# Patient Record
Sex: Female | Born: 1970 | Race: White | Hispanic: No | Marital: Married | Smoking: Former smoker
Health system: Southern US, Community
[De-identification: ages and names within clinical notes are randomized; demographics above are authoritative.]

## PROBLEM LIST (undated history)

## (undated) DIAGNOSIS — G4700 Insomnia, unspecified: Secondary | ICD-10-CM

## (undated) DIAGNOSIS — B019 Varicella without complication: Secondary | ICD-10-CM

## (undated) DIAGNOSIS — E663 Overweight: Secondary | ICD-10-CM

## (undated) DIAGNOSIS — R5383 Other fatigue: Secondary | ICD-10-CM

## (undated) DIAGNOSIS — Z0000 Encounter for general adult medical examination without abnormal findings: Secondary | ICD-10-CM

## (undated) DIAGNOSIS — M791 Myalgia, unspecified site: Secondary | ICD-10-CM

## (undated) DIAGNOSIS — R110 Nausea: Secondary | ICD-10-CM

## (undated) DIAGNOSIS — E559 Vitamin D deficiency, unspecified: Principal | ICD-10-CM

## (undated) DIAGNOSIS — N951 Menopausal and female climacteric states: Secondary | ICD-10-CM

## (undated) HISTORY — DX: Menopausal and female climacteric states: N95.1

## (undated) HISTORY — DX: Overweight: E66.3

## (undated) HISTORY — DX: Encounter for general adult medical examination without abnormal findings: Z00.00

## (undated) HISTORY — DX: Varicella without complication: B01.9

## (undated) HISTORY — DX: Myalgia, unspecified site: M79.10

## (undated) HISTORY — DX: Insomnia, unspecified: G47.00

## (undated) HISTORY — DX: Other fatigue: R53.83

## (undated) HISTORY — DX: Nausea: R11.0

## (undated) HISTORY — DX: Vitamin D deficiency, unspecified: E55.9

---

## 1998-06-30 ENCOUNTER — Other Ambulatory Visit: Admission: RE | Admit: 1998-06-30 | Discharge: 1998-06-30 | Payer: Self-pay | Admitting: Gynecology

## 1999-02-07 ENCOUNTER — Inpatient Hospital Stay (HOSPITAL_COMMUNITY): Admission: AD | Admit: 1999-02-07 | Discharge: 1999-02-07 | Payer: Self-pay

## 1999-02-08 ENCOUNTER — Inpatient Hospital Stay (HOSPITAL_COMMUNITY): Admission: AD | Admit: 1999-02-08 | Discharge: 1999-02-09 | Payer: Self-pay | Admitting: Family Medicine

## 1999-07-17 ENCOUNTER — Other Ambulatory Visit: Admission: RE | Admit: 1999-07-17 | Discharge: 1999-07-17 | Payer: Self-pay | Admitting: Obstetrics & Gynecology

## 2000-11-25 ENCOUNTER — Other Ambulatory Visit: Admission: RE | Admit: 2000-11-25 | Discharge: 2000-11-25 | Payer: Self-pay | Admitting: Obstetrics and Gynecology

## 2002-02-15 HISTORY — PX: OTHER SURGICAL HISTORY: SHX169

## 2002-02-15 HISTORY — PX: BREAST SURGERY: SHX581

## 2003-12-12 ENCOUNTER — Other Ambulatory Visit: Admission: RE | Admit: 2003-12-12 | Discharge: 2003-12-12 | Payer: Self-pay | Admitting: Obstetrics and Gynecology

## 2005-03-15 ENCOUNTER — Emergency Department (HOSPITAL_COMMUNITY): Admission: EM | Admit: 2005-03-15 | Discharge: 2005-03-15 | Payer: Self-pay | Admitting: Emergency Medicine

## 2005-05-07 ENCOUNTER — Other Ambulatory Visit: Admission: RE | Admit: 2005-05-07 | Discharge: 2005-05-07 | Payer: Self-pay | Admitting: Obstetrics and Gynecology

## 2011-06-16 LAB — HM PAP SMEAR

## 2011-06-16 LAB — HM MAMMOGRAPHY

## 2011-09-23 ENCOUNTER — Ambulatory Visit (INDEPENDENT_AMBULATORY_CARE_PROVIDER_SITE_OTHER): Payer: BC Managed Care – PPO | Admitting: Family Medicine

## 2011-09-23 ENCOUNTER — Encounter: Payer: Self-pay | Admitting: Family Medicine

## 2011-09-23 VITALS — BP 150/78 | HR 82 | Temp 97.4°F | Ht 62.25 in | Wt 151.8 lb

## 2011-09-23 DIAGNOSIS — E663 Overweight: Secondary | ICD-10-CM | POA: Insufficient documentation

## 2011-09-23 DIAGNOSIS — R5383 Other fatigue: Secondary | ICD-10-CM

## 2011-09-23 DIAGNOSIS — Z0000 Encounter for general adult medical examination without abnormal findings: Secondary | ICD-10-CM

## 2011-09-23 DIAGNOSIS — G4700 Insomnia, unspecified: Secondary | ICD-10-CM | POA: Insufficient documentation

## 2011-09-23 DIAGNOSIS — E559 Vitamin D deficiency, unspecified: Secondary | ICD-10-CM

## 2011-09-23 HISTORY — DX: Vitamin D deficiency, unspecified: E55.9

## 2011-09-23 HISTORY — DX: Encounter for general adult medical examination without abnormal findings: Z00.00

## 2011-09-23 HISTORY — DX: Other fatigue: R53.83

## 2011-09-23 LAB — CBC
Hemoglobin: 13.1 g/dL (ref 12.0–15.0)
MCHC: 33.4 g/dL (ref 30.0–36.0)
RDW: 13 % (ref 11.5–14.6)
WBC: 6.1 10*3/uL (ref 4.5–10.5)

## 2011-09-23 LAB — LIPID PANEL
HDL: 48.7 mg/dL (ref >39.00)
Total CHOL/HDL Ratio: 4
Triglycerides: 80 mg/dL (ref 0.0–149.0)
VLDL: 16 mg/dL (ref 0.0–40.0)

## 2011-09-23 LAB — RENAL FUNCTION PANEL
BUN: 10 mg/dL (ref 6–23)
CO2: 27 mEq/L (ref 19–32)
Chloride: 104 mEq/L (ref 96–112)
GFR: 75.34 mL/min (ref >60.00)
Phosphorus: 2.7 mg/dL (ref 2.3–4.6)
Potassium: 4.1 mEq/L (ref 3.5–5.1)

## 2011-09-23 LAB — HEPATIC FUNCTION PANEL
Alkaline Phosphatase: 52 U/L (ref 39–117)
Bilirubin, Direct: 0 mg/dL (ref 0.0–0.3)
Total Bilirubin: 0.4 mg/dL (ref 0.3–1.2)

## 2011-09-23 LAB — TSH: TSH: 1.73 u[IU]/mL (ref 0.35–5.50)

## 2011-09-23 LAB — LDL CHOLESTEROL, DIRECT: Direct LDL: 154.7 mg/dL

## 2011-09-23 NOTE — Assessment & Plan Note (Signed)
Requesting old records from previous PMD and OB/GYN she has had a pap this year but agrees to transfer her care in that regard when a pap is due. Encouraged 7-8 hours of sleep, regular exercise, DASH diet. Labs ordered today

## 2011-09-23 NOTE — Assessment & Plan Note (Signed)
Given paperwork on DASH diet, avoid simple carbs and trans fats, check labs, add regular exercise.

## 2011-09-23 NOTE — Assessment & Plan Note (Signed)
Multifactorial encouraged healthy diet with less carbs, 7-8 hours of sleep and regular exercise, check tsh and cbc

## 2011-09-23 NOTE — Progress Notes (Signed)
Patient ID: Natasha Watts, female   DOB: 25-Sep-1970, 41 y.o.   MRN: [id number] Natasha Watts [id number] 04-Mar-1970 09/23/2011      Progress Note-Follow Up  Subjective  Chief Complaint  Chief Complaint  Patient presents with  . Establish Care    new patient    HPI  Patient is a 41 year old Caucasian female who is in today for new patient appointment. Her major concerns regard sleeping, fatigue and weight gain. She struggles with the inability to fall sleep or stay asleep and only gets 4 hours of sleep most nights. She has always been a light sleeper but at this point also her husband. She notes as a result of poor time concentrating and struggles with constant fatigue. She's no longer exercising due to her fatigue and is frustrated regarding her weight gain. She finds herself craving sweets salty starchy carbohydrates. She's not had any recent acute illness. She is noting some trouble with pain in her soles of her feet. No injury but pain is worse at the end of the long day. No swelling, warmth or redness is noted. She denies any recent chest pain, palpitations, shortness of breath, GI or GU concerns.  Past Medical History  Diagnosis Date  . Chicken pox as a child  . Insomnia   . Overweight   . Vitamin d deficiency 09/23/2011  . Preventative health care 09/23/2011  . Fatigue 09/23/2011    Past Surgical History  Procedure Date  . Breast surgery 2004    breast lift  . Tummy tuck 2004    Family History  Problem Relation Age of Onset  . Cancer Maternal Grandmother   . Cancer Maternal Grandfather     esophagus  . Asthma Daughter     History   Social History  . Marital Status: Married    Spouse Name: N/A    Number of Children: N/A  . Years of Education: N/A   Occupational History  . Not on file.   Social History Main Topics  . Smoking status: Former Smoker -- 1.0 packs/day for 6 years    Types: Cigarettes    Quit date: 02/15/1990  . Smokeless tobacco: Never Used  .  Alcohol Use: No  . Drug Use: No  . Sexually Active: Not on file   Other Topics Concern  . Not on file   Social History Narrative  . No narrative on file    No current outpatient prescriptions on file prior to visit.    Allergies  Allergen Reactions  . Hydrocodone     Itch, felt like she was going "crazy"    Review of Systems  Review of Systems  Constitutional: Positive for malaise/fatigue. Negative for fever and chills.  HENT: Negative for hearing loss, nosebleeds and congestion.   Eyes: Negative for discharge.  Respiratory: Negative for cough, sputum production, shortness of breath and wheezing.   Cardiovascular: Negative for chest pain, palpitations and leg swelling.  Gastrointestinal: Negative for heartburn, nausea, vomiting, abdominal pain, diarrhea, constipation and blood in stool.  Genitourinary: Negative for dysuria, urgency, frequency and hematuria.  Musculoskeletal: Negative for myalgias, back pain and falls.  Skin: Negative for rash.  Neurological: Negative for dizziness, tremors, sensory change, focal weakness, loss of consciousness, weakness and headaches.  Endo/Heme/Allergies: Negative for polydipsia. Does not bruise/bleed easily.  Psychiatric/Behavioral: Negative for depression and suicidal ideas. The patient is nervous/anxious and has insomnia.     Objective  BP 150/78  Pulse 82  Temp 97.4 F (36.3 C) (Temporal)  Ht 5' 2.25" (1.581 m)  Wt 151 lb 12.8 oz (68.856 kg)  BMI 27.54 kg/m2  SpO2 97%  LMP 09/17/2011  Physical Exam  Physical Exam  Constitutional: She is oriented to person, place, and time and well-developed, well-nourished, and in no distress. No distress.  HENT:  Head: Normocephalic and atraumatic.  Right Ear: External ear normal.  Left Ear: External ear normal.  Nose: Nose normal.  Mouth/Throat: Oropharynx is clear and moist. No oropharyngeal exudate.  Eyes: Conjunctivae are normal. Pupils are equal, round, and reactive to light.  Right eye exhibits no discharge. Left eye exhibits no discharge. No scleral icterus.  Neck: Normal range of motion. Neck supple. No thyromegaly present.  Cardiovascular: Normal rate, regular rhythm, normal heart sounds and intact distal pulses.   No murmur heard. Pulmonary/Chest: Effort normal and breath sounds normal. No respiratory distress. She has no wheezes. She has no rales.  Abdominal: Soft. Bowel sounds are normal. She exhibits no distension and no mass. There is no tenderness.  Musculoskeletal: Normal range of motion. She exhibits no edema and no tenderness.  Lymphadenopathy:    She has no cervical adenopathy.  Neurological: She is alert and oriented to person, place, and time. She has normal reflexes. No cranial nerve deficit. Coordination normal.  Skin: Skin is warm and dry. No rash noted. She is not diaphoretic.  Psychiatric: Mood, memory and affect normal.       Assessment & Plan  Preventative health care Requesting old records from previous PMD and OB/GYN she has had a pap this year but agrees to transfer her care in that regard when a pap is due. Encouraged 7-8 hours of sleep, regular exercise, DASH diet. Labs ordered today  Fatigue Multifactorial encouraged healthy diet with less carbs, 7-8 hours of sleep and regular exercise, check tsh and cbc  Insomnia Try Benadryl prn, discussed sleep hygiene at length with need for dark, cool quiet room  Overweight Given paperwork on DASH diet, avoid simple carbs and trans fats, check labs, add regular exercise.

## 2011-09-23 NOTE — Assessment & Plan Note (Signed)
Try Benadryl prn, discussed sleep hygiene at length with need for dark, cool quiet room

## 2011-09-23 NOTE — Patient Instructions (Addendum)
Preventive Care for Adults, Female A healthy lifestyle and preventive care can promote health and wellness. Preventive health guidelines for women include the following key practices.  A routine yearly physical is a good way to check with your caregiver about your health and preventive screening. It is a chance to share any concerns and updates on your health, and to receive a thorough exam.   Visit your dentist for a routine exam and preventive care every 6 months. Brush your teeth twice a day and floss once a day. Good oral hygiene prevents tooth decay and gum disease.   The frequency of eye exams is based on your age, health, family medical history, use of contact lenses, and other factors. Follow your caregiver's recommendations for frequency of eye exams.   Eat a healthy diet. Foods like vegetables, fruits, whole grains, low-fat dairy products, and lean protein foods contain the nutrients you need without too many calories. Decrease your intake of foods high in solid fats, added sugars, and salt. Eat the right amount of calories for you.Get information about a proper diet from your caregiver, if necessary.   Regular physical exercise is one of the most important things you can do for your health. Most adults should get at least 150 minutes of moderate-intensity exercise (any activity that increases your heart rate and causes you to sweat) each week. In addition, most adults need muscle-strengthening exercises on 2 or more days a week.   Maintain a healthy weight. The body mass index (BMI) is a screening tool to identify possible weight problems. It provides an estimate of body fat based on height and weight. Your caregiver can help determine your BMI, and can help you achieve or maintain a healthy weight.For adults 20 years and older:   A BMI below 18.5 is considered underweight.   A BMI of 18.5 to 24.9 is normal.   A BMI of 25 to 29.9 is considered overweight.   A BMI of 30 and above is  considered obese.   Maintain normal blood lipids and cholesterol levels by exercising and minimizing your intake of saturated fat. Eat a balanced diet with plenty of fruit and vegetables. Blood tests for lipids and cholesterol should begin at age 20 and be repeated every 5 years. If your lipid or cholesterol levels are high, you are over 50, or you are at high risk for heart disease, you may need your cholesterol levels checked more frequently.Ongoing high lipid and cholesterol levels should be treated with medicines if diet and exercise are not effective.   If you smoke, find out from your caregiver how to quit. If you do not use tobacco, do not start.   If you are pregnant, do not drink alcohol. If you are breastfeeding, be very cautious about drinking alcohol. If you are not pregnant and choose to drink alcohol, do not exceed 1 drink per day. One drink is considered to be 12 ounces (355 mL) of beer, 5 ounces (148 mL) of wine, or 1.5 ounces (44 mL) of liquor.   Avoid use of street drugs. Do not share needles with anyone. Ask for help if you need support or instructions about stopping the use of drugs.   High blood pressure causes heart disease and increases the risk of stroke. Your blood pressure should be checked at least every 1 to 2 years. Ongoing high blood pressure should be treated with medicines if weight loss and exercise are not effective.   If you are 55 to 43   years old, ask your caregiver if you should take aspirin to prevent strokes.   Diabetes screening involves taking a blood sample to check your fasting blood sugar level. This should be done once every 3 years, after age 45, if you are within normal weight and without risk factors for diabetes. Testing should be considered at a younger age or be carried out more frequently if you are overweight and have at least 1 risk factor for diabetes.   Breast cancer screening is essential preventive care for women. You should practice "breast  self-awareness." This means understanding the normal appearance and feel of your breasts and may include breast self-examination. Any changes detected, no matter how small, should be reported to a caregiver. Women in their 20s and 30s should have a clinical breast exam (CBE) by a caregiver as part of a regular health exam every 1 to 3 years. After age 40, women should have a CBE every year. Starting at age 40, women should consider having a mammography (breast X-ray test) every year. Women who have a family history of breast cancer should talk to their caregiver about genetic screening. Women at a high risk of breast cancer should talk to their caregivers about having magnetic resonance imaging (MRI) and a mammography every year.   The Pap test is a screening test for cervical cancer. A Pap test can show cell changes on the cervix that might become cervical cancer if left untreated. A Pap test is a procedure in which cells are obtained and examined from the lower end of the uterus (cervix).   Women should have a Pap test starting at age 21.   Between ages 21 and 29, Pap tests should be repeated every 2 years.   Beginning at age 30, you should have a Pap test every 3 years as long as the past 3 Pap tests have been normal.   Some women have medical problems that increase the chance of getting cervical cancer. Talk to your caregiver about these problems. It is especially important to talk to your caregiver if a new problem develops soon after your last Pap test. In these cases, your caregiver may recommend more frequent screening and Pap tests.   The above recommendations are the same for women who have or have not gotten the vaccine for human papillomavirus (HPV).   If you had a hysterectomy for a problem that was not cancer or a condition that could lead to cancer, then you no longer need Pap tests. Even if you no longer need a Pap test, a regular exam is a good idea to make sure no other problems are  starting.   If you are between ages 65 and 70, and you have had normal Pap tests going back 10 years, you no longer need Pap tests. Even if you no longer need a Pap test, a regular exam is a good idea to make sure no other problems are starting.   If you have had past treatment for cervical cancer or a condition that could lead to cancer, you need Pap tests and screening for cancer for at least 20 years after your treatment.   If Pap tests have been discontinued, risk factors (such as a new sexual partner) need to be reassessed to determine if screening should be resumed.   The HPV test is an additional test that may be used for cervical cancer screening. The HPV test looks for the virus that can cause the cell changes on the cervix.   The cells collected during the Pap test can be tested for HPV. The HPV test could be used to screen women aged 30 years and older, and should be used in women of any age who have unclear Pap test results. After the age of 30, women should have HPV testing at the same frequency as a Pap test.   Colorectal cancer can be detected and often prevented. Most routine colorectal cancer screening begins at the age of 50 and continues through age 75. However, your caregiver may recommend screening at an earlier age if you have risk factors for colon cancer. On a yearly basis, your caregiver may provide home test kits to check for hidden blood in the stool. Use of a small camera at the end of a tube, to directly examine the colon (sigmoidoscopy or colonoscopy), can detect the earliest forms of colorectal cancer. Talk to your caregiver about this at age 50, when routine screening begins. Direct examination of the colon should be repeated every 5 to 10 years through age 75, unless early forms of pre-cancerous polyps or small growths are found.   Hepatitis C blood testing is recommended for all people born from 1945 through 1965 and any individual with known risks for hepatitis C.    Practice safe sex. Use condoms and avoid high-risk sexual practices to reduce the spread of sexually transmitted infections (STIs). STIs include gonorrhea, chlamydia, syphilis, trichomonas, herpes, HPV, and human immunodeficiency virus (HIV). Herpes, HIV, and HPV are viral illnesses that have no cure. They can result in disability, cancer, and death. Sexually active women aged 25 and younger should be checked for chlamydia. Older women with new or multiple partners should also be tested for chlamydia. Testing for other STIs is recommended if you are sexually active and at increased risk.   Osteoporosis is a disease in which the bones lose minerals and strength with aging. This can result in serious bone fractures. The risk of osteoporosis can be identified using a bone density scan. Women ages 65 and over and women at risk for fractures or osteoporosis should discuss screening with their caregivers. Ask your caregiver whether you should take a calcium supplement or vitamin D to reduce the rate of osteoporosis.   Menopause can be associated with physical symptoms and risks. Hormone replacement therapy is available to decrease symptoms and risks. You should talk to your caregiver about whether hormone replacement therapy is right for you.   Use sunscreen with sun protection factor (SPF) of 30 or more. Apply sunscreen liberally and repeatedly throughout the day. You should seek shade when your shadow is shorter than you. Protect yourself by wearing long sleeves, pants, a wide-brimmed hat, and sunglasses year round, whenever you are outdoors.   Once a month, do a whole body skin exam, using a mirror to look at the skin on your back. Notify your caregiver of new moles, moles that have irregular borders, moles that are larger than a pencil eraser, or moles that have changed in shape or color.   Stay current with required immunizations.   Influenza. You need a dose every fall (or winter). The composition of  the flu vaccine changes each year, so being vaccinated once is not enough.   Pneumococcal polysaccharide. You need 1 to 2 doses if you smoke cigarettes or if you have certain chronic medical conditions. You need 1 dose at age 65 (or older) if you have never been vaccinated.   Tetanus, diphtheria, pertussis (Tdap, Td). Get 1 dose of   Tdap vaccine if you are younger than age 65, are over 65 and have contact with an infant, are a healthcare worker, are pregnant, or simply want to be protected from whooping cough. After that, you need a Td booster dose every 10 years. Consult your caregiver if you have not had at least 3 tetanus and diphtheria-containing shots sometime in your life or have a deep or dirty wound.   HPV. You need this vaccine if you are a woman age 26 or younger. The vaccine is given in 3 doses over 6 months.   Measles, mumps, rubella (MMR). You need at least 1 dose of MMR if you were born in 1957 or later. You may also need a second dose.   Meningococcal. If you are age 19 to 21 and a first-year college student living in a residence hall, or have one of several medical conditions, you need to get vaccinated against meningococcal disease. You may also need additional booster doses.   Zoster (shingles). If you are age 60 or older, you should get this vaccine.   Varicella (chickenpox). If you have never had chickenpox or you were vaccinated but received only 1 dose, talk to your caregiver to find out if you need this vaccine.   Hepatitis A. You need this vaccine if you have a specific risk factor for hepatitis A virus infection or you simply wish to be protected from this disease. The vaccine is usually given as 2 doses, 6 to 18 months apart.   Hepatitis B. You need this vaccine if you have a specific risk factor for hepatitis B virus infection or you simply wish to be protected from this disease. The vaccine is given in 3 doses, usually over 6 months.  Preventive Services /  Frequency Ages 19 to 39  Blood pressure check.** / Every 1 to 2 years.   Lipid and cholesterol check.** / Every 5 years beginning at age 20.   Clinical breast exam.** / Every 3 years for women in their 20s and 30s.   Pap test.** / Every 2 years from ages 21 through 29. Every 3 years starting at age 30 through age 65 or 70 with a history of 3 consecutive normal Pap tests.   HPV screening.** / Every 3 years from ages 30 through ages 65 to 70 with a history of 3 consecutive normal Pap tests.   Hepatitis C blood test.** / For any individual with known risks for hepatitis C.   Skin self-exam. / Monthly.   Influenza immunization.** / Every year.   Pneumococcal polysaccharide immunization.** / 1 to 2 doses if you smoke cigarettes or if you have certain chronic medical conditions.   Tetanus, diphtheria, pertussis (Tdap, Td) immunization. / A one-time dose of Tdap vaccine. After that, you need a Td booster dose every 10 years.   HPV immunization. / 3 doses over 6 months, if you are 26 and younger.   Measles, mumps, rubella (MMR) immunization. / You need at least 1 dose of MMR if you were born in 1957 or later. You may also need a second dose.   Meningococcal immunization. / 1 dose if you are age 19 to 21 and a first-year college student living in a residence hall, or have one of several medical conditions, you need to get vaccinated against meningococcal disease. You may also need additional booster doses.   Varicella immunization.** / Consult your caregiver.   Hepatitis A immunization.** / Consult your caregiver. 2 doses, 6 to 18 months   apart.   Hepatitis B immunization.** / Consult your caregiver. 3 doses usually over 6 months.  Ages 40 to 64  Blood pressure check.** / Every 1 to 2 years.   Lipid and cholesterol check.** / Every 5 years beginning at age 20.   Clinical breast exam.** / Every year after age 40.   Mammogram.** / Every year beginning at age 40 and continuing for as  long as you are in good health. Consult with your caregiver.   Pap test.** / Every 3 years starting at age 30 through age 65 or 70 with a history of 3 consecutive normal Pap tests.   HPV screening.** / Every 3 years from ages 30 through ages 65 to 70 with a history of 3 consecutive normal Pap tests.   Fecal occult blood test (FOBT) of stool. / Every year beginning at age 50 and continuing until age 75. You may not need to do this test if you get a colonoscopy every 10 years.   Flexible sigmoidoscopy or colonoscopy.** / Every 5 years for a flexible sigmoidoscopy or every 10 years for a colonoscopy beginning at age 50 and continuing until age 75.   Hepatitis C blood test.** / For all people born from 1945 through 1965 and any individual with known risks for hepatitis C.   Skin self-exam. / Monthly.   Influenza immunization.** / Every year.   Pneumococcal polysaccharide immunization.** / 1 to 2 doses if you smoke cigarettes or if you have certain chronic medical conditions.   Tetanus, diphtheria, pertussis (Tdap, Td) immunization.** / A one-time dose of Tdap vaccine. After that, you need a Td booster dose every 10 years.   Measles, mumps, rubella (MMR) immunization. / You need at least 1 dose of MMR if you were born in 1957 or later. You may also need a second dose.   Varicella immunization.** / Consult your caregiver.   Meningococcal immunization.** / Consult your caregiver.   Hepatitis A immunization.** / Consult your caregiver. 2 doses, 6 to 18 months apart.   Hepatitis B immunization.** / Consult your caregiver. 3 doses, usually over 6 months.  Ages 65 and over  Blood pressure check.** / Every 1 to 2 years.   Lipid and cholesterol check.** / Every 5 years beginning at age 20.   Clinical breast exam.** / Every year after age 40.   Mammogram.** / Every year beginning at age 40 and continuing for as long as you are in good health. Consult with your caregiver.   Pap test.** /  Every 3 years starting at age 30 through age 65 or 70 with a 3 consecutive normal Pap tests. Testing can be stopped between 65 and 70 with 3 consecutive normal Pap tests and no abnormal Pap or HPV tests in the past 10 years.   HPV screening.** / Every 3 years from ages 30 through ages 65 or 70 with a history of 3 consecutive normal Pap tests. Testing can be stopped between 65 and 70 with 3 consecutive normal Pap tests and no abnormal Pap or HPV tests in the past 10 years.   Fecal occult blood test (FOBT) of stool. / Every year beginning at age 50 and continuing until age 75. You may not need to do this test if you get a colonoscopy every 10 years.   Flexible sigmoidoscopy or colonoscopy.** / Every 5 years for a flexible sigmoidoscopy or every 10 years for a colonoscopy beginning at age 50 and continuing until age 75.   Hepatitis   C blood test.** / For all people born from 1945 through 1965 and any individual with known risks for hepatitis C.   Osteoporosis screening.** / A one-time screening for women ages 65 and over and women at risk for fractures or osteoporosis.   Skin self-exam. / Monthly.   Influenza immunization.** / Every year.   Pneumococcal polysaccharide immunization.** / 1 dose at age 65 (or older) if you have never been vaccinated.   Tetanus, diphtheria, pertussis (Tdap, Td) immunization. / A one-time dose of Tdap vaccine if you are over 65 and have contact with an infant, are a healthcare worker, or simply want to be protected from whooping cough. After that, you need a Td booster dose every 10 years.   Varicella immunization.** / Consult your caregiver.   Meningococcal immunization.** / Consult your caregiver.   Hepatitis A immunization.** / Consult your caregiver. 2 doses, 6 to 18 months apart.   Hepatitis B immunization.** / Check with your caregiver. 3 doses, usually over 6 months.  ** Family history and personal history of risk and conditions may change your caregiver's  recommendations. Document Released: 03/30/2001 Document Revised: 01/21/2011 Document Reviewed: 06/29/2010 [employer] Patient Information 2012 [employer]. 

## 2011-09-27 LAB — VITAMIN D 1,25 DIHYDROXY
Vitamin D2 1, 25 (OH)2: <8 pg/mL
Vitamin D3 1, 25 (OH)2: 20 pg/mL

## 2011-11-03 ENCOUNTER — Ambulatory Visit (INDEPENDENT_AMBULATORY_CARE_PROVIDER_SITE_OTHER): Payer: BC Managed Care – PPO | Admitting: Family Medicine

## 2011-11-03 ENCOUNTER — Encounter: Payer: Self-pay | Admitting: Family Medicine

## 2011-11-03 VITALS — BP 111/72 | HR 71 | Temp 98.5°F | Ht 62.25 in | Wt 151.1 lb

## 2011-11-03 DIAGNOSIS — E663 Overweight: Secondary | ICD-10-CM

## 2011-11-03 DIAGNOSIS — M791 Myalgia, unspecified site: Secondary | ICD-10-CM | POA: Insufficient documentation

## 2011-11-03 DIAGNOSIS — E559 Vitamin D deficiency, unspecified: Secondary | ICD-10-CM

## 2011-11-03 DIAGNOSIS — R5381 Other malaise: Secondary | ICD-10-CM

## 2011-11-03 DIAGNOSIS — IMO0001 Reserved for inherently not codable concepts without codable children: Secondary | ICD-10-CM

## 2011-11-03 DIAGNOSIS — R5383 Other fatigue: Secondary | ICD-10-CM

## 2011-11-03 DIAGNOSIS — E7801 Familial hypercholesterolemia: Secondary | ICD-10-CM | POA: Insufficient documentation

## 2011-11-03 DIAGNOSIS — E7800 Pure hypercholesterolemia, unspecified: Secondary | ICD-10-CM

## 2011-11-03 DIAGNOSIS — Z23 Encounter for immunization: Secondary | ICD-10-CM

## 2011-11-03 DIAGNOSIS — G4700 Insomnia, unspecified: Secondary | ICD-10-CM

## 2011-11-03 HISTORY — DX: Myalgia, unspecified site: M79.10

## 2011-11-03 MED ORDER — PHENTERMINE HCL 15 MG PO CAPS: 15.0000 mg | ORAL_CAPSULE | ORAL | Status: DC

## 2011-11-03 NOTE — Progress Notes (Signed)
Patient ID: Natasha Watts, female   DOB: 04/07/1970, 42 y.o.   MRN: [id number] Natasha Watts [id number] 06-Nov-1970 11/03/2011      Progress Note-Follow Up  Subjective  Chief Complaint  Chief Complaint  Patient presents with  . Follow-up    HPI  Patient is a 41 year old Caucasian female who is in today for followup on patient appointment. Labs were reviewed and questions answered. She is questioning whether be monthly testosterone shots here. She reports in the past her OB/GYN is given to her when she's noted decreased libido and fatigue. She finds that helpful temporarily. She struggling with fatigue and decreased libido presently. She also complains of myalgias diffusely but also some right elbow pain and worsening pain in bilateral knees especially crouching. No trauma or injury. No recent illness, fevers, chills, chest pain, palpitations, shortness of breath, GI or GU complaints otherwise noted today. She tried to start the DASH diet last visit but reports after a few days she was to follow up due to her cravings for sweets and salty foods.  Past Medical History  Diagnosis Date  . Chicken pox as a child  . Insomnia   . Overweight   . Vitamin d deficiency 09/23/2011  . Preventative health care 09/23/2011  . Fatigue 09/23/2011  . Myalgia 11/03/2011    Past Surgical History  Procedure Date  . Breast surgery 2004    breast lift  . Tummy tuck 2004    Family History  Problem Relation Age of Onset  . Cancer Maternal Grandmother   . Cancer Maternal Grandfather     esophagus  . Asthma Daughter     History   Social History  . Marital Status: Married    Spouse Name: N/A    Number of Children: N/A  . Years of Education: N/A   Occupational History  . Not on file.   Social History Main Topics  . Smoking status: Former Smoker -- 1.0 packs/day for 6 years    Types: Cigarettes    Quit date: 02/15/1990  . Smokeless tobacco: Never Used  . Alcohol Use: No  . Drug Use: No  .  Sexually Active: Yes   Other Topics Concern  . Not on file   Social History Narrative  . No narrative on file    Current Outpatient Prescriptions on File Prior to Visit  Medication Sig Dispense Refill  . Cholecalciferol (VITAMIN D PO) Take by mouth daily.      . Multiple Vitamin (MULTIVITAMIN) tablet Take 1 tablet by mouth daily.      . phentermine 15 MG capsule Take 1 capsule (15 mg total) by mouth every morning.  30 capsule  0    Allergies  Allergen Reactions  . Hydrocodone     Itch, felt like she was going "crazy"    Review of Systems  Review of Systems  Constitutional: Positive for malaise/fatigue. Negative for fever.  HENT: Negative for congestion.   Eyes: Negative for discharge.  Respiratory: Negative for shortness of breath.   Cardiovascular: Negative for chest pain, palpitations and leg swelling.  Gastrointestinal: Negative for nausea, abdominal pain and diarrhea.  Genitourinary: Negative for dysuria.  Musculoskeletal: Positive for myalgias and joint pain. Negative for falls.  Skin: Negative for rash.  Neurological: Negative for loss of consciousness and headaches.  Endo/Heme/Allergies: Negative for polydipsia.  Psychiatric/Behavioral: Negative for depression and suicidal ideas. The patient is not nervous/anxious and does not have insomnia.     Objective  BP 111/72  Pulse  71  Temp 98.5 F (36.9 C) (Temporal)  Ht 5' 2.25" (1.581 m)  Wt 151 lb 1.9 oz (68.548 kg)  BMI 27.42 kg/m2  SpO2 99%  LMP 10/15/2011  Physical Exam  Physical Exam  Constitutional: She is oriented to person, place, and time and well-developed, well-nourished, and in no distress. No distress.  HENT:  Head: Normocephalic and atraumatic.  Eyes: Conjunctivae normal are normal.  Neck: Neck supple. No thyromegaly present.  Cardiovascular: Normal rate, regular rhythm and normal heart sounds.   No murmur heard. Pulmonary/Chest: Effort normal and breath sounds normal. She has no wheezes.    Abdominal: She exhibits no distension and no mass.  Musculoskeletal: She exhibits no edema.  Lymphadenopathy:    She has no cervical adenopathy.  Neurological: She is alert and oriented to person, place, and time.  Skin: Skin is warm and dry. No rash noted. She is not diaphoretic.  Psychiatric: Memory, affect and judgment normal.    Lab Results  Component Value Date   TSH 1.73 09/23/2011   Lab Results  Component Value Date   WBC 6.1 09/23/2011   HGB 13.1 09/23/2011   HCT 39.2 09/23/2011   MCV 94.8 09/23/2011   PLT 241.0 09/23/2011   Lab Results  Component Value Date   CREATININE 0.9 09/23/2011   BUN 10 09/23/2011   NA 137 09/23/2011   K 4.1 09/23/2011   CL 104 09/23/2011   CO2 27 09/23/2011   Lab Results  Component Value Date   ALT 9 09/23/2011   AST 19 09/23/2011   ALKPHOS 52 09/23/2011   BILITOT 0.4 09/23/2011   Lab Results  Component Value Date   CHOL 211* 09/23/2011   Lab Results  Component Value Date   HDL 48.70 09/23/2011   No results found for this basename: [facility]   Lab Results  Component Value Date   TRIG 80.0 09/23/2011   Lab Results  Component Value Date   CHOLHDL 4 09/23/2011     Assessment & Plan  Fatigue Patient continues to struggle. Labs show a normal thyroid, no anemia etc. She has received testosterone shots from her OB/GYN in past. She will return there if she decides she wants to resume shots but she is warned we do not have good long term data on testosterone treatments  In women  Insomnia She continues to struggle, did try Benadryl and she thinks it helped some but she has not continued to use it. We discussed sleep hygiene again and she is advised to try the Benadryl first before making any other changes  Vitamin d deficiency Normal but low normal, continue Vitamin D gummies (possibly 2000IU) and add a Citracal petite daily  Overweight Encouraged DASH diet, increased exercise, has taken Phentermine in past without difficulty. Will start her back on it at 15 mg  daily and reassess next month or as needed  Myalgia C/o knee pain (esp with squatting). Elbow pain, muscle pain etc. She questions whether she could have fibromyalgia, She makes note of the fact that her DAD carried the diagnosis as well. She is advised to stay on her Glucosamin and add MegaRed krill oil cap. repor nay improvment

## 2011-11-03 NOTE — Patient Instructions (Addendum)
Cholesterol Cholesterol is a white, waxy, fat-like protein needed by your body in small amounts. The liver makes all the cholesterol you need. It is carried from the liver by the blood through the blood vessels. Deposits (plaque) may build up on blood vessel walls. This makes the arteries narrower and stiffer. Plaque increases the risk for heart attack and stroke. You cannot feel your cholesterol level even if it is very high. The only way to know is by a blood test to check your lipid (fats) levels. Once you know your cholesterol levels, you should keep a record of the test results. Work with your caregiver to to keep your levels in the desired range. WHAT THE RESULTS MEAN:  Total cholesterol is a rough measure of all the cholesterol in your blood.   LDL is the so-called bad cholesterol. This is the type that deposits cholesterol in the walls of the arteries. You want this level to be low.   HDL is the good cholesterol because it cleans the arteries and carries the LDL away. You want this level to be high.   Triglycerides are fat that the body can either burn for energy or store. High levels are closely linked to heart disease.  DESIRED LEVELS:  Total cholesterol below 200.   LDL below 100 for people at risk, below 70 for very high risk.   HDL above 50 is good, above 60 is best.   Triglycerides below 150.  HOW TO LOWER YOUR CHOLESTEROL:  Diet.   Choose fish or white meat chicken and [place], roasted or baked. Limit fatty cuts of red meat, fried foods, and processed meats, such as sausage and lunch meat.   Eat lots of fresh fruits and vegetables. Choose whole grains, beans, pasta, potatoes and cereals.   Use only small amounts of olive, corn or canola oils. Avoid butter, mayonnaise, shortening or palm kernel oils. Avoid foods with trans-fats.   Use skim/nonfat milk and low-fat/nonfat yogurt and cheeses. Avoid whole milk, cream, ice cream, egg yolks and cheeses. Healthy desserts include  angel food cake, ginger snaps, animal crackers, hard candy, popsicles, and low-fat/nonfat frozen yogurt. Avoid pastries, cakes, pies and cookies.   Exercise.   A regular program helps decrease LDL and raises HDL.   Helps with weight control.   Do things that increase your activity level like gardening, walking, or taking the stairs.   Medication.   May be prescribed by your caregiver to help lowering cholesterol and the risk for heart disease.   You may need medicine even if your levels are normal if you have several risk factors.  HOME CARE INSTRUCTIONS   Follow your diet and exercise programs as suggested by your caregiver.   Take medications as directed.   Have blood work done when your caregiver feels it is necessary.  MAKE SURE YOU:   Understand these instructions.   Will watch your condition.   Will get help right away if you are not doing well or get worse.  Document Released: 10/27/2000 Document Revised: 01/21/2011 Document Reviewed: 04/19/2007 [facility] Patient Information 2012 [place], [place].  MegaRed 1 cap po daily Add a Citracal daily

## 2011-11-03 NOTE — Assessment & Plan Note (Signed)
She continues to struggle, did try Benadryl and she thinks it helped some but she has not continued to use it. We discussed sleep hygiene again and she is advised to try the Benadryl first before making any other changes

## 2011-11-03 NOTE — Assessment & Plan Note (Signed)
Normal but low normal, continue Vitamin D gummies (possibly 2000IU) and add a Citracal petite daily

## 2011-11-03 NOTE — Assessment & Plan Note (Signed)
Patient continues to struggle. Labs show a normal thyroid, no anemia etc. She has received testosterone shots from her OB/GYN in past. She will return there if she decides she wants to resume shots but she is warned we do not have good long term data on testosterone treatments  In women

## 2011-11-03 NOTE — Assessment & Plan Note (Signed)
Encouraged DASH diet, increased exercise, has taken Phentermine in past without difficulty. Will start her back on it at 15 mg daily and reassess next month or as needed

## 2011-11-03 NOTE — Assessment & Plan Note (Signed)
C/o knee pain (esp with squatting). Elbow pain, muscle pain etc. She questions whether she could have fibromyalgia, She makes note of the fact that her DAD carried the diagnosis as well. She is advised to stay on her Glucosamin and add MegaRed krill oil cap. repor nay improvment

## 2011-12-03 ENCOUNTER — Ambulatory Visit: Payer: BC Managed Care – PPO | Admitting: Family Medicine

## 2012-06-23 ENCOUNTER — Ambulatory Visit: Payer: BC Managed Care – PPO | Admitting: Family Medicine

## 2012-06-26 ENCOUNTER — Ambulatory Visit (INDEPENDENT_AMBULATORY_CARE_PROVIDER_SITE_OTHER): Payer: BC Managed Care – PPO | Admitting: Family Medicine

## 2012-06-26 ENCOUNTER — Encounter: Payer: Self-pay | Admitting: Family Medicine

## 2012-06-26 VITALS — BP 116/82 | HR 71 | Temp 97.7°F | Ht 62.25 in | Wt 156.1 lb

## 2012-06-26 DIAGNOSIS — R5381 Other malaise: Secondary | ICD-10-CM

## 2012-06-26 DIAGNOSIS — N951 Menopausal and female climacteric states: Secondary | ICD-10-CM

## 2012-06-26 DIAGNOSIS — E785 Hyperlipidemia, unspecified: Secondary | ICD-10-CM

## 2012-06-26 DIAGNOSIS — R1013 Epigastric pain: Secondary | ICD-10-CM

## 2012-06-26 DIAGNOSIS — E559 Vitamin D deficiency, unspecified: Secondary | ICD-10-CM

## 2012-06-26 DIAGNOSIS — G4700 Insomnia, unspecified: Secondary | ICD-10-CM

## 2012-06-26 DIAGNOSIS — R110 Nausea: Secondary | ICD-10-CM

## 2012-06-26 LAB — LIPID PANEL
Cholesterol: 194 mg/dL (ref 0–200)
Total CHOL/HDL Ratio: 4.1 Ratio

## 2012-06-26 LAB — RENAL FUNCTION PANEL
Albumin: 4.3 g/dL (ref 3.5–5.2)
CO2: 24 mEq/L (ref 19–32)
Creat: 0.93 mg/dL (ref 0.50–1.10)

## 2012-06-26 LAB — HEPATIC FUNCTION PANEL
ALT: 20 U/L (ref 0–35)
AST: 19 U/L (ref 0–37)
Albumin: 4.3 g/dL (ref 3.5–5.2)
Total Bilirubin: 0.4 mg/dL (ref 0.3–1.2)
Total Protein: 6.8 g/dL (ref 6.0–8.3)

## 2012-06-26 LAB — CBC
HCT: 38.9 % (ref 36.0–46.0)
Hemoglobin: 13.3 g/dL (ref 12.0–15.0)
MCH: 30.7 pg (ref 26.0–34.0)
MCV: 89.8 fL (ref 78.0–100.0)
RBC: 4.33 MIL/uL (ref 3.87–5.11)

## 2012-06-26 LAB — FOLLICLE STIMULATING HORMONE: FSH: 19.6 m[IU]/mL

## 2012-06-26 LAB — LUTEINIZING HORMONE: LH: 27.5 m[IU]/mL

## 2012-06-26 MED ORDER — ONDANSETRON HCL 8 MG PO TABS: 8.0000 mg | ORAL_TABLET | Freq: Three times a day (TID) | ORAL | Status: AC | PRN

## 2012-06-26 NOTE — Patient Instructions (Addendum)
Probiotic such as Digestive Advantage  Krill oil caps MegaRed daily  Next visit annual   DASH Diet The DASH diet stands for "Dietary Approaches to Stop Hypertension." It is a healthy eating plan that has been shown to reduce high blood pressure (hypertension) in as little as 14 days, while also possibly providing other significant health benefits. These other health benefits include reducing the risk of breast cancer after menopause and reducing the risk of type 2 diabetes, heart disease, colon cancer, and stroke. Health benefits also include weight loss and slowing kidney failure in patients with chronic kidney disease.  DIET GUIDELINES  Limit salt (sodium). Your diet should contain less than 1500 mg of sodium daily.  Limit refined or processed carbohydrates. Your diet should include mostly whole grains. Desserts and added sugars should be used sparingly.  Include small amounts of heart-healthy fats. These types of fats include nuts, oils, and tub margarine. Limit saturated and trans fats. These fats have been shown to be harmful in the body. CHOOSING FOODS  The following food groups are based on a 2000 calorie diet. See your Registered Dietitian for individual calorie needs. Grains and Grain Products (6 to 8 servings daily)  Eat More Often: Whole-wheat bread, brown rice, whole-grain or wheat pasta, quinoa, popcorn without added fat or salt (air popped).  Eat Less Often: White bread, white pasta, white rice, cornbread. Vegetables (4 to 5 servings daily)  Eat More Often: Fresh, frozen, and canned vegetables. Vegetables may be raw, steamed, roasted, or grilled with a minimal amount of fat.  Eat Less Often/Avoid: Creamed or fried vegetables. Vegetables in a cheese sauce. Fruit (4 to 5 servings daily)  Eat More Often: All fresh, canned (in natural juice), or frozen fruits. Dried fruits without added sugar. One hundred percent fruit juice ( cup [237 mL] daily).  Eat Less Often: Dried  fruits with added sugar. Canned fruit in light or heavy syrup. [employer], Fish, and Poultry (2 servings or less daily. One serving is 3 to 4 oz [85-114 g]).  Eat More Often: Ninety percent or leaner ground beef, tenderloin, sirloin. Round cuts of beef, chicken breast, [place] breast. All fish. Grill, bake, or broil your meat. Nothing should be fried.  Eat Less Often/Avoid: Fatty cuts of meat, [place], or chicken leg, thigh, or wing. Fried cuts of meat or fish. Dairy (2 to 3 servings)  Eat More Often: Low-fat or fat-free milk, low-fat plain or light yogurt, reduced-fat or part-skim cheese.  Eat Less Often/Avoid: Milk (whole, 2%).Whole milk yogurt. Full-fat cheeses. Nuts, Seeds, and Legumes (4 to 5 servings per week)  Eat More Often: All without added salt.  Eat Less Often/Avoid: Salted nuts and seeds, canned beans with added salt. Fats and Sweets (limited)  Eat More Often: Vegetable oils, tub margarines without trans fats, sugar-free gelatin. Mayonnaise and salad dressings.  Eat Less Often/Avoid: Coconut oils, palm oils, butter, stick margarine, cream, half and half, cookies, candy, pie. FOR MORE INFORMATION The Dash Diet Eating Plan: www.dashdiet.org Document Released: 01/21/2011 Document Revised: 04/26/2011 Document Reviewed: 01/21/2011 [facility] Patient Information 2013 [place], [place].

## 2012-06-26 NOTE — Progress Notes (Signed)
Patient ID: Natasha Watts, female   DOB: 04-17-1970, 42 y.o.   MRN: [id number] BAUDELIA SCHROEPFER [id number] 07/18/1970 06/26/2012      Progress Note-Follow Up  Subjective  Chief Complaint  Chief Complaint  Patient presents with  . Nausea    HPI  Patient is a 42 year old female complaining nausea after she the last few days. No fevers or chills. No anorexia or vomiting. She describes sensitivity focal. No diarrhea but there's been some mild hardness to her stools. No chest pain, palpitations, shortness of breath or acute illness. She is noting menses have recently changed in intensity therapy and is having some difficulties with fatigue, myalgias and hot flashes, as well as increased irritability  Past Medical History  Diagnosis Date  . Chicken pox as a child  . Insomnia   . Overweight   . Vitamin D deficiency 09/23/2011  . Preventative health care 09/23/2011  . Fatigue 09/23/2011  . Myalgia 11/03/2011    Past Surgical History  Procedure Laterality Date  . Breast surgery  2004    breast lift  . Tummy tuck  2004    Family History  Problem Relation Age of Onset  . Cancer Maternal Grandmother   . Cancer Maternal Grandfather     esophagus  . Asthma Daughter     History   Social History  . Marital Status: Married    Spouse Name: N/A    Number of Children: N/A  . Years of Education: N/A   Occupational History  . Not on file.   Social History Main Topics  . Smoking status: Former Smoker -- 1.00 packs/day for 6 years    Types: Cigarettes    Quit date: 02/15/1990  . Smokeless tobacco: Never Used  . Alcohol Use: No  . Drug Use: No  . Sexually Active: Yes   Other Topics Concern  . Not on file   Social History Narrative  . No narrative on file    Current Outpatient Prescriptions on File Prior to Visit  Medication Sig Dispense Refill  . Multiple Vitamin (MULTIVITAMIN) tablet Take 1 tablet by mouth daily.      . Cholecalciferol (VITAMIN D PO) Take by mouth daily.        No current facility-administered medications on file prior to visit.    Allergies  Allergen Reactions  . Hydrocodone     Itch, felt like she was going "crazy"    Review of Systems  Review of Systems  Constitutional: Positive for malaise/fatigue. Negative for fever.  HENT: Negative for congestion.   Eyes: Negative for discharge.  Respiratory: Negative for shortness of breath.   Cardiovascular: Negative for chest pain, palpitations and leg swelling.  Gastrointestinal: Positive for nausea and abdominal pain. Negative for diarrhea and constipation.  Genitourinary: Negative for dysuria.  Musculoskeletal: Negative for falls.  Skin: Negative for rash.  Neurological: Negative for loss of consciousness and headaches.  Endo/Heme/Allergies: Negative for polydipsia.  Psychiatric/Behavioral: Negative for depression and suicidal ideas. The patient is nervous/anxious. The patient does not have insomnia.     Objective  BP 116/82  Pulse 71  Temp(Src) 97.7 F (36.5 C) (Oral)  Ht 5' 2.25" (1.581 m)  Wt 156 lb 1.3 oz (70.797 kg)  BMI 28.32 kg/m2  SpO2 97%  LMP 06/15/2012  Physical Exam  Physical Exam  Constitutional: She is oriented to person, place, and time and well-developed, well-nourished, and in no distress. No distress.  HENT:  Head: Normocephalic and atraumatic.  Eyes: Conjunctivae are normal.  Neck: Neck supple. No thyromegaly present.  Cardiovascular: Normal rate, regular rhythm and normal heart sounds.  Exam reveals no gallop.   No murmur heard. Pulmonary/Chest: Effort normal and breath sounds normal. She has no wheezes.  Abdominal: She exhibits no distension and no mass.  Musculoskeletal: She exhibits no edema.  Lymphadenopathy:    She has no cervical adenopathy.  Neurological: She is alert and oriented to person, place, and time.  Skin: Skin is warm and dry. No rash noted. She is not diaphoretic.  Psychiatric: Memory, affect and judgment normal.    Lab Results   Component Value Date   TSH 1.73 09/23/2011   Lab Results  Component Value Date   WBC 6.1 09/23/2011   HGB 13.1 09/23/2011   HCT 39.2 09/23/2011   MCV 94.8 09/23/2011   PLT 241.0 09/23/2011   Lab Results  Component Value Date   CREATININE 0.9 09/23/2011   BUN 10 09/23/2011   NA 137 09/23/2011   K 4.1 09/23/2011   CL 104 09/23/2011   CO2 27 09/23/2011   Lab Results  Component Value Date   ALT 9 09/23/2011   AST 19 09/23/2011   ALKPHOS 52 09/23/2011   BILITOT 0.4 09/23/2011   Lab Results  Component Value Date   CHOL 211* 09/23/2011   Lab Results  Component Value Date   HDL 48.70 09/23/2011   No results found for this basename: [facility]   Lab Results  Component Value Date   TRIG 80.0 09/23/2011   Lab Results  Component Value Date   CHOLHDL 4 09/23/2011     Assessment & Plan  Nausea alone Maintain bland diet, try ginger prn. H Pylori negative. Given Zofran to use prn. Start probiotics  Insomnia Try melatonin prn or Benadryl, discussed slee p hygiene  Perimenopause Numbers suggestive of menopause when checking fsh, lh, patient to report any concerning symptoms or bleeding. Discussed need to limit carbs and increase exercise. Can try ICool

## 2012-06-30 ENCOUNTER — Encounter: Payer: Self-pay | Admitting: Family Medicine

## 2012-06-30 DIAGNOSIS — R110 Nausea: Secondary | ICD-10-CM

## 2012-06-30 DIAGNOSIS — N951 Menopausal and female climacteric states: Secondary | ICD-10-CM

## 2012-06-30 HISTORY — DX: Menopausal and female climacteric states: N95.1

## 2012-06-30 HISTORY — DX: Nausea: R11.0

## 2012-06-30 NOTE — Assessment & Plan Note (Addendum)
Maintain bland diet, try ginger prn. H Pylori negative. Given Zofran to use prn. Start probiotics

## 2012-06-30 NOTE — Assessment & Plan Note (Addendum)
Numbers suggestive of menopause when checking fsh, lh, patient to report any concerning symptoms or bleeding. Discussed need to limit carbs and increase exercise. Can try ICool

## 2012-06-30 NOTE — Assessment & Plan Note (Signed)
Try melatonin prn or Benadryl, discussed slee p hygiene

## 2012-09-25 ENCOUNTER — Encounter: Payer: BC Managed Care – PPO | Admitting: Family Medicine

## 2012-09-25 DIAGNOSIS — Z0289 Encounter for other administrative examinations: Secondary | ICD-10-CM

## 2012-12-21 ENCOUNTER — Other Ambulatory Visit: Payer: Self-pay

## 2015-05-08 ENCOUNTER — Ambulatory Visit: Payer: Self-pay | Admitting: Medical

## 2020-09-03 ENCOUNTER — Ambulatory Visit: Payer: Self-pay | Admitting: Orthopaedic Surgery

## 2020-09-03 ENCOUNTER — Ambulatory Visit (INDEPENDENT_AMBULATORY_CARE_PROVIDER_SITE_OTHER): Payer: Managed Care, Other (non HMO)

## 2020-09-03 ENCOUNTER — Encounter: Payer: Self-pay | Admitting: Orthopaedic Surgery

## 2020-09-03 ENCOUNTER — Ambulatory Visit (INDEPENDENT_AMBULATORY_CARE_PROVIDER_SITE_OTHER): Payer: Managed Care, Other (non HMO) | Admitting: Orthopaedic Surgery

## 2020-09-03 VITALS — Ht 62.25 in | Wt 134.0 lb

## 2020-09-03 DIAGNOSIS — M25511 Pain in right shoulder: Secondary | ICD-10-CM

## 2020-09-03 MED ORDER — METHYLPREDNISOLONE ACETATE 40 MG/ML IJ SUSP
40.0000 mg | INTRAMUSCULAR | Status: AC | PRN
  Administered 2020-09-03: 40 mg via INTRA_ARTICULAR

## 2020-09-03 MED ORDER — LIDOCAINE HCL 1 % IJ SOLN
3.0000 mL | INTRAMUSCULAR | Status: AC | PRN
  Administered 2020-09-03: 3 mL

## 2020-09-03 NOTE — Progress Notes (Signed)
Office Visit Note   Patient: Natasha Watts           Date of Birth: 30-Apr-1970           MRN: [id number] Visit Date: 09/03/2020              Requested by: Mickie Kay, PA [address] [place],  [place] [id number] PCP: Mickie Kay, [place]   Assessment & Plan: Visit Diagnoses:  1. Acute pain of right shoulder     Plan: She definitely seems to have significant signs of impingement of the right shoulder and I recommended she work on getting it moving more.  I want her to take Aleve twice a day and recommended a steroid injection in the subacromial outlet which she agreed to and tolerated well.  All questions and  concerns were answered addressed.  I would like to reevaluate her in 4 weeks with a repeat exam.  Follow-Up Instructions: Return in about 4 weeks (around 10/01/2020).   Orders:  Orders Placed This Encounter  Procedures   Large Joint Inj   XR Shoulder Right   No orders of the defined types were placed in this encounter.     Procedures: Large Joint Inj: R subacromial bursa on 09/03/2020 1:50 PM Indications: pain and diagnostic evaluation Details: 22 G 1.5 in needle  Arthrogram: No  Medications: 3 mL lidocaine 1 %; 40 mg methylPREDNISolone acetate 40 MG/ML Outcome: tolerated well, no immediate complications Procedure, treatment alternatives, risks and benefits explained, specific risks discussed. Consent was given by the patient. Immediately prior to procedure a time out was called to verify the correct patient, procedure, equipment, support staff and site/side marked as required. Patient was prepped and draped in the usual sterile fashion.      Clinical Data: No additional findings.   Subjective: Chief Complaint  Patient presents with   Right Shoulder - Pain  The patient is a very active 50 year old female who comes in for evaluation treatment of acute right shoulder pain this been going on for about 2 or 3 months now.  She just started  trying exercising again doing push-ups and not sure if that is related to her shoulder hurting.  She denies any specific injury.  She said there is a lot of popping in the shoulder and feels like he gets caught.  She has noticed decreased range of motion as well.  She has pain reaching behind her and overhead.  She said her primary care physician gave her an intramuscular steroid injection with no relief.  She has not taken any oral anti-inflammatories.  She has never had surgery on that shoulder.  HPI  Review of Systems   Objective: Vital Signs: Ht 5' 2.25" (1.581 m)   Wt 134 lb (60.8 kg)   BMI 24.31 kg/m   Physical Exam She is alert and oriented x3 and in no acute distress Ortho Exam Examination of her right shoulder shows definitely positive signs of impingement.  Her internal rotation with adduction is just above the lumbar spine area and painful.  Her abduction is painful as well.  There is no weakness.  Her liftoff is negative. Specialty Comments:  No specialty comments available.  Imaging: XR Shoulder Right  Result Date: 09/03/2020 3 views of the right shoulder showed no acute findings.    PMFS History: Patient Active Problem List   Diagnosis Date Noted   Perimenopause 06/30/2012   Nausea alone 06/30/2012   Myalgia 11/03/2011   Hyperlipidemia type  II 11/03/2011   Vitamin D deficiency 09/23/2011   Preventative health care 09/23/2011   Fatigue 09/23/2011   Insomnia    Overweight(278.02)    Past Medical History:  Diagnosis Date   Chicken pox as a child   Fatigue 09/23/2011   Insomnia    Myalgia 11/03/2011   Nausea alone 06/30/2012   Overweight(278.02)    Perimenopause 06/30/2012   Preventative health care 09/23/2011   Vitamin D deficiency 09/23/2011    Family History  Problem Relation Age of Onset   Cancer Maternal Grandmother    Cancer Maternal Grandfather        esophagus   Asthma Daughter     Past Surgical History:  Procedure Laterality Date   BREAST SURGERY   2004   breast lift   tummy tuck  2004   Social History   Occupational History   Not on file  Tobacco Use   Smoking status: Former    Packs/day: 1.00    Years: 6.00    Pack years: 6.00    Types: Cigarettes    Quit date: 02/15/1990    Years since quitting: 30.5   Smokeless tobacco: Never  Substance and Sexual Activity   Alcohol use: No   Drug use: No   Sexual activity: Yes

## 2020-10-01 ENCOUNTER — Ambulatory Visit: Payer: Self-pay | Admitting: Orthopaedic Surgery

## 2020-10-15 ENCOUNTER — Ambulatory Visit (INDEPENDENT_AMBULATORY_CARE_PROVIDER_SITE_OTHER): Payer: Managed Care, Other (non HMO) | Admitting: Orthopaedic Surgery

## 2020-10-15 ENCOUNTER — Encounter: Payer: Self-pay | Admitting: Orthopaedic Surgery

## 2020-10-15 DIAGNOSIS — M7541 Impingement syndrome of right shoulder: Secondary | ICD-10-CM | POA: Diagnosis not present

## 2020-10-15 DIAGNOSIS — M25511 Pain in right shoulder: Secondary | ICD-10-CM

## 2020-10-15 NOTE — Progress Notes (Signed)
The patient is following up for right shoulder impingement.  At her last visit on 20 July we did inject the subacromial space and she says she feels much better overall.  She has a little bit of pain with overhead activities and still some popping in her shoulder but is very satisfied.  Her shoulder range of motion is almost entirely full except for a little limitations with internal rotation combined with adduction.  Reaching behind her causes some pain.  Overall her rotator cuff is strong and her shoulder moves fluidly smoothly.    At this point follow-up can be as needed.  I would always recommend a second injection if she ends up needing it.

## 2021-01-06 ENCOUNTER — Ambulatory Visit (INDEPENDENT_AMBULATORY_CARE_PROVIDER_SITE_OTHER): Payer: Managed Care, Other (non HMO) | Admitting: Orthopaedic Surgery

## 2021-01-06 ENCOUNTER — Other Ambulatory Visit: Payer: Self-pay

## 2021-01-06 DIAGNOSIS — G8929 Other chronic pain: Secondary | ICD-10-CM

## 2021-01-06 DIAGNOSIS — M25511 Pain in right shoulder: Secondary | ICD-10-CM

## 2021-01-06 MED ORDER — BUPIVACAINE HCL 0.25 % IJ SOLN
2.0000 mL | INTRAMUSCULAR | Status: AC | PRN
  Administered 2021-01-06: 2 mL via INTRA_ARTICULAR

## 2021-01-06 MED ORDER — LIDOCAINE HCL 2 % IJ SOLN
2.0000 mL | INTRAMUSCULAR | Status: AC | PRN
  Administered 2021-01-06: 2 mL

## 2021-01-06 MED ORDER — METHYLPREDNISOLONE ACETATE 40 MG/ML IJ SUSP
40.0000 mg | INTRAMUSCULAR | Status: AC | PRN
  Administered 2021-01-06: 40 mg via INTRA_ARTICULAR

## 2021-01-06 NOTE — Progress Notes (Signed)
Office Visit Note   Patient: Natasha Watts           Date of Birth: [birth year]           MRN: [id number] Visit Date: 01/06/2021              Requested by: Mickie Kay, PA [address] [place],  [place] [address] PCP: Mickie Kay, [place]   Assessment & Plan: Visit Diagnoses:  1. Chronic right shoulder pain     Plan: Impression is right shoulder impingement syndrome and possible rotator cuff pathology.  At this point, have discussed repeat subacromial cortisone injection for which she would like to proceed.  If her symptoms persist we have discussed possible MRI but I would like for her to discuss this with Dr. Magnus Ivan.  Otherwise, follow-up as needed. .   Follow-Up Instructions: Return if symptoms worsen or fail to improve.   Orders:  Orders Placed This Encounter  Procedures   Large Joint Inj: R subacromial bursa    No orders of the defined types were placed in this encounter.     Procedures: Large Joint Inj: R subacromial bursa on 01/06/2021 3:13 PM Indications: pain Details: 22 G needle Medications: 2 mL lidocaine 2 %; 2 mL bupivacaine 0.25 %; 40 mg methylPREDNISolone acetate 40 MG/ML Outcome: tolerated well, no immediate complications Patient was prepped and draped in the usual sterile fashion.      Clinical Data: No additional findings.   Subjective: Chief Complaint  Patient presents with   Right Shoulder - Follow-up    HPI patient is a very pleasant 50 year old female patient of Dr. Eliberto Ivory who comes in today with recurrent right shoulder pain.  She has been seen in the past where she was diagnosed with impingement syndrome.  She had a subacromial cortisone injection back in July which helped quite a bit for a few months.  Her pain has returned and has largely worsened.  The pain she has is primarily to the deltoid and is increased with shoulder abduction and internal rotation.  She has noticed slight weakness to the right upper  extremity.  She does not take any medication for the pain.  She denies any paresthesias to the right upper extremity.    Review of Systems as detailed in HPI.  All others reviewed and are negative.   Objective: Vital Signs: There were no vitals taken for this visit.  Physical Exam well-developed well-nourished female no acute distress.  Alert and oriented x3.  Ortho Exam right shoulder exam reveals full forward flexion and external rotation.  She can internally rotate to L5.  Positive empty can and positive Hawkins.  Full strength throughout.  She is neurovascular intact distally.  Specialty Comments:  No specialty comments available.  Imaging: No new imaging   PMFS History: Patient Active Problem List   Diagnosis Date Noted   Perimenopause 06/30/2012   Nausea alone 06/30/2012   Myalgia 11/03/2011   Hyperlipidemia type II 11/03/2011   Vitamin D deficiency 09/23/2011   Preventative health care 09/23/2011   Fatigue 09/23/2011   Insomnia    Overweight(278.02)    Past Medical History:  Diagnosis Date   Chicken pox as a child   Fatigue 09/23/2011   Insomnia    Myalgia 11/03/2011   Nausea alone 06/30/2012   Overweight(278.02)    Perimenopause 06/30/2012   Preventative health care 09/23/2011   Vitamin D deficiency 09/23/2011    Family History  Problem Relation Age of Onset  Cancer Maternal Grandmother    Cancer Maternal Grandfather        esophagus   Asthma Daughter     Past Surgical History:  Procedure Laterality Date   BREAST SURGERY  2004   breast lift   tummy tuck  2004   Social History   Occupational History   Not on file  Tobacco Use   Smoking status: Former    Packs/day: 1.00    Years: 6.00    Pack years: 6.00    Types: Cigarettes    Quit date: 02/15/1990    Years since quitting: 30.9   Smokeless tobacco: Never  Substance and Sexual Activity   Alcohol use: No   Drug use: No   Sexual activity: Yes

## 2021-01-20 ENCOUNTER — Ambulatory Visit: Payer: Managed Care, Other (non HMO) | Admitting: Orthopaedic Surgery

## 2021-06-07 ENCOUNTER — Emergency Department (HOSPITAL_BASED_OUTPATIENT_CLINIC_OR_DEPARTMENT_OTHER): Payer: Commercial Managed Care - PPO

## 2021-06-07 ENCOUNTER — Emergency Department (HOSPITAL_BASED_OUTPATIENT_CLINIC_OR_DEPARTMENT_OTHER)
Admission: EM | Admit: 2021-06-07 | Discharge: 2021-06-07 | Disposition: A | Discharge status: Home / Self Care | Payer: Commercial Managed Care - PPO | Attending: Emergency Medicine | Admitting: Emergency Medicine

## 2021-06-07 ENCOUNTER — Encounter (HOSPITAL_BASED_OUTPATIENT_CLINIC_OR_DEPARTMENT_OTHER): Payer: Self-pay

## 2021-06-07 ENCOUNTER — Other Ambulatory Visit: Payer: Self-pay

## 2021-06-07 DIAGNOSIS — R002 Palpitations: Secondary | ICD-10-CM

## 2021-06-07 DIAGNOSIS — R42 Dizziness and giddiness: Secondary | ICD-10-CM | POA: Diagnosis not present

## 2021-06-07 DIAGNOSIS — I493 Ventricular premature depolarization: Secondary | ICD-10-CM | POA: Insufficient documentation

## 2021-06-07 LAB — BASIC METABOLIC PANEL
Anion gap: 8 (ref 5–15)
BUN: 14 mg/dL (ref 6–20)
CO2: 26 mmol/L (ref 22–32)
Calcium: 9.4 mg/dL (ref 8.9–10.3)
Chloride: 106 mmol/L (ref 98–111)
Creatinine, Ser: 0.83 mg/dL (ref 0.44–1.00)
GFR, Estimated: >60 mL/min (ref >60)
Glucose, Bld: 91 mg/dL (ref 70–99)
Potassium: 3.8 mmol/L (ref 3.5–5.1)
Sodium: 140 mmol/L (ref 135–145)

## 2021-06-07 LAB — TROPONIN I (HIGH SENSITIVITY)
Troponin I (High Sensitivity): <2 ng/L (ref <18)
Troponin I (High Sensitivity): <2 ng/L (ref <18)

## 2021-06-07 LAB — CBC
HCT: 36.8 % (ref 36.0–46.0)
Hemoglobin: 12.4 g/dL (ref 12.0–15.0)
MCH: 31.8 pg (ref 26.0–34.0)
MCHC: 33.7 g/dL (ref 30.0–36.0)
MCV: 94.4 fL (ref 80.0–100.0)
Platelets: 240 10*3/uL (ref 150–400)
RBC: 3.9 MIL/uL (ref 3.87–5.11)
RDW: 12.4 % (ref 11.5–15.5)
WBC: 6.9 10*3/uL (ref 4.0–10.5)
nRBC: 0 % (ref 0.0–0.2)

## 2021-06-07 LAB — URINALYSIS, ROUTINE W REFLEX MICROSCOPIC
Bilirubin Urine: NEGATIVE
Glucose, UA: NEGATIVE mg/dL
Ketones, ur: NEGATIVE mg/dL
Leukocytes,Ua: NEGATIVE
Nitrite: NEGATIVE
Protein, ur: NEGATIVE mg/dL
Specific Gravity, Urine: 1.01 (ref 1.005–1.030)
pH: 7 (ref 5.0–8.0)

## 2021-06-07 LAB — MAGNESIUM: Magnesium: 2.2 mg/dL (ref 1.7–2.4)

## 2021-06-07 NOTE — ED Provider Notes (Signed)
?[facility] EMERGENCY DEPT ?Provider Note ? ? ?CSN: [id number] ?Arrival date & time: 06/07/21  0200 ? ?  ? ?History ? ?Chief Complaint  ?Patient presents with  ? Palpitations  ? ? ?TAKIMA ENCINA is a 51 y.o. female. ? ?The history is provided by the patient.  ?Palpitations ?NILAH BELCOURT is a 51 y.o. female who presents to the Emergency Department complaining of palpitations.  She presents to the ED accompanied by her daughter for palpitations that started a few days ago.  Sxs are constant but less severe since arriving to the ED.  She developed a burning/reflux sensation and discomfort in her chest/back around 1am, now resolved.   ? ?No sob.  Felt slightly lightheaded.   ? ?No leg swelling/pain.   ?No recent travel.   ?Has experienced brief flutters in the past, nothing consistent.  ? ?Takes herbal supplements.  No hormones.   ? ?Staying away from caffeine for several months.   ?No tobacco, alcohol, drugs.   ?  ? ?Home Medications ?Prior to Admission medications   ?Medication Sig Start Date End Date Taking? Authorizing Provider  ?Cholecalciferol (VITAMIN D PO) Take by mouth daily.    [provider]  ?diclofenac Sodium (VOLTAREN) 1 % GEL Apply 2 g topically 4 (four) times daily. 08/07/20   [provider]  ?Multiple Vitamin (MULTIVITAMIN) tablet Take 1 tablet by mouth daily.    [provider]  ?ondansetron (ZOFRAN) 8 MG tablet Take 1 tablet (8 mg total) by mouth every 8 (eight) hours as needed for nausea. 06/26/12   Bradd Canary, MD  ?tiZANidine (ZANAFLEX) 4 MG tablet Take 4 mg by mouth every 6 (six) hours. 08/07/20   [provider]  ?   ? ?Allergies    ?Hydrocodone   ? ?Review of Systems   ?Review of Systems  ?Cardiovascular:  Positive for palpitations.  ?All other systems reviewed and are negative. ? ?Physical Exam ?Updated Vital Signs ?BP (!) 112/49 (BP Location: Right Arm)   Pulse 74   Temp 97.7 ?F (36.5 ?C) (Oral)   Resp 20   Ht 5\' 2"  (1.575 m)   Wt  65.8 kg   LMP 06/06/2021   SpO2 99%   BMI 26.52 kg/m?  ?Physical Exam ?Vitals and nursing note reviewed.  ?Constitutional:   ?   Appearance: She is well-developed.  ?HENT:  ?   Head: Normocephalic and atraumatic.  ?Cardiovascular:  ?   Rate and Rhythm: Normal rate and regular rhythm.  ?   Heart sounds: No murmur heard. ?Pulmonary:  ?   Effort: Pulmonary effort is normal. No respiratory distress.  ?   Breath sounds: Normal breath sounds.  ?Abdominal:  ?   Palpations: Abdomen is soft.  ?   Tenderness: There is no abdominal tenderness. There is no guarding or rebound.  ?Musculoskeletal:     ?   General: No swelling or tenderness.  ?Skin: ?   General: Skin is warm and dry.  ?Neurological:  ?   Mental Status: She is alert and oriented to person, place, and time.  ?Psychiatric:     ?   Behavior: Behavior normal.  ? ? ?ED Results / Procedures / Treatments   ?Labs ?(all labs ordered are listed, but only abnormal results are displayed) ?Labs Reviewed  ?URINALYSIS, ROUTINE W REFLEX MICROSCOPIC - Abnormal; Notable for the following components:  ?    Result Value  ? Color, Urine COLORLESS (*)   ? Hgb urine dipstick SMALL (*)   ?  All other components within normal limits  ?BASIC METABOLIC PANEL  ?CBC  ?MAGNESIUM  ?TROPONIN I (HIGH SENSITIVITY)  ?TROPONIN I (HIGH SENSITIVITY)  ? ? ?EKG ?EKG Interpretation ? ?Date/Time:  Sunday June 07 2021 02:16:18 EDT ?Ventricular Rate:  74 ?PR Interval:  178 ?QRS Duration: 83 ?QT Interval:  378 ?QTC Calculation: 420 ?R Axis:   76 ?Text Interpretation: Sinus rhythm Low voltage, precordial leads Confirmed by Tilden Fossa [phone]) on 06/07/2021 2:57:52 AM ? ?Radiology ?DG Chest Port 1 View ? ?Result Date: 06/07/2021 ?CLINICAL DATA:  Heart palpitations with intermittent shortness of breath and chest tightness. EXAM: PORTABLE CHEST 1 VIEW COMPARISON:  No comparison studies FINDINGS: The heart size and mediastinal contours are within normal limits. Both lungs are clear. The visualized skeletal  structures are unremarkable. Bilateral breast augmentations are faintly visible. IMPRESSION: No active disease. Electronically Signed   By: Almira Bar M.D.   On: 06/07/2021 02:52   ? ?Procedures ?Procedures  ? ? ?Medications Ordered in ED ?Medications - No data to display ? ?ED Course/ Medical Decision Making/ A&P ?  ?                        ?Medical Decision Making ?Amount and/or Complexity of Data Reviewed ?Labs: ordered. ?Radiology: ordered. ? ? ?Patient here for evaluation of palpitations for the last few days.  She is nontoxic-appearing on evaluation with no respiratory distress.  She did have some chest tightness this evening.  EKG is without acute ischemic changes.  Patient does have relatively frequent PVCs on the monitor, occurring 1 to a few times per minute.  No runs of V. tach.  Electrolytes are within normal limits.  CBC without anemia.  Chest x-ray without acute abnormality, images personally reviewed.  Troponins are negative x2.  Current clinical picture is not consistent with ACS, PE, dissection, CHF.  Discussed with patient home care for palpitations.  Will refer to cardiology.  Discussed outpatient follow-up and return precautions. ? ? ? ? ? ? ? ?Final Clinical Impression(s) / ED Diagnoses ?Final diagnoses:  ?Palpitations  ?PVC's (premature ventricular contractions)  ? ? ?Rx / DC Orders ?ED Discharge Orders   ? ?      Ordered  ?  Ambulatory referral to Cardiology       ?Comments: Symptomatic palpitations with multiple PVCs during ED stay  ? 06/07/21 0429  ? ?  ?  ? ?  ? ? ?  ?Tilden Fossa, MD ?06/07/21 [phone] ? ?

## 2021-06-07 NOTE — ED Triage Notes (Signed)
Pt arrives with c/o palpitations that about 3 days ago. Per pt, she has had some intermittent SOB and chest tightness.  ?

## 2021-06-10 ENCOUNTER — Ambulatory Visit (INDEPENDENT_AMBULATORY_CARE_PROVIDER_SITE_OTHER): Payer: Commercial Managed Care - PPO | Admitting: Cardiovascular Disease

## 2021-06-10 ENCOUNTER — Encounter: Payer: Self-pay | Admitting: Cardiovascular Disease

## 2021-06-10 ENCOUNTER — Ambulatory Visit (INDEPENDENT_AMBULATORY_CARE_PROVIDER_SITE_OTHER): Payer: Commercial Managed Care - PPO

## 2021-06-10 VITALS — BP 102/60 | HR 69 | Ht 62.0 in | Wt 145.6 lb

## 2021-06-10 DIAGNOSIS — E785 Hyperlipidemia, unspecified: Secondary | ICD-10-CM

## 2021-06-10 DIAGNOSIS — R002 Palpitations: Secondary | ICD-10-CM

## 2021-06-10 NOTE — Patient Instructions (Signed)
Medication Instructions:  ?Your physician recommends that you continue on your current medications as directed. Please refer to the Current Medication list given to you today. ? ?*If you need a refill on your cardiac medications before your next appointment, please call your pharmacy* ? ? ?Lab Work: ?TSH, Lipids Today ?If you have labs (blood work) drawn today and your tests are completely normal, you will receive your results only by: ?MyChart Message (if you have MyChart) OR ?A paper copy in the mail ?If you have any lab test that is abnormal or we need to change your treatment, we will call you to review the results. ? ? ?Testing/Procedures: ?14 day Zio monitor ?Your physician has recommended that you wear an event monitor. Event monitors are medical devices that record the heart?s electrical activity. Doctors most often [place] these monitors to diagnose arrhythmias. Arrhythmias are problems with the speed or rhythm of the heartbeat. The monitor is a small, portable device. You can wear one while you do your normal daily activities. This is usually used to diagnose what is causing palpitations/syncope (passing out). ? ? ? ?Follow-Up: ?At [facility], you and your health needs are our priority.  As part of our continuing mission to provide you with exceptional heart care, we have created designated Provider Care Teams.  These Care Teams include your primary Cardiologist (physician) and Advanced Practice Providers (APPs -  Physician Assistants and Nurse Practitioners) who all work together to provide you with the care you need, when you need it. ? ?We recommend signing up for the patient portal called "MyChart".  Sign up information is provided on this After Visit Summary.  MyChart is used to connect with patients for Virtual Visits (Telemedicine).  Patients are able to view lab/test results, encounter notes, upcoming appointments, etc.  Non-urgent messages can be sent to your provider as well.   ?To learn more  about what you can do with MyChart, go to [email or web address].   ? ?Your next appointment:   ?3 month(s) ? ?The format for your next appointment:   ?In Person ? ?Provider:   ?Mertie Moores, MD ? ?If primary card or EP is not listed click here to update    :1}  ? ? ?Other Instructions ?ZIO XT- Long Term Monitor Instructions ? ?Your physician has requested you wear a ZIO patch monitor for 14 days.  ?This is a single patch monitor. [employer] supplies one patch monitor per enrollment. Additional ?stickers are not available. Please do not apply patch if you will be having a Nuclear Stress Test,  ?Echocardiogram, Cardiac CT, MRI, or Chest Xray during the period you would be wearing the  ?monitor. The patch cannot be worn during these tests. You cannot remove and re-apply the  ?ZIO XT patch monitor.  ?Your ZIO patch monitor will be mailed 3 day USPS to your address on file. It may take 3-5 days  ?to receive your monitor after you have been enrolled.  ?Once you have received your monitor, please review the enclosed instructions. Your monitor  ?has already been registered assigning a specific monitor serial # to you. ? ?Billing and Patient Assistance Program Information ? ?We have supplied [employer] with any of your insurance information on file for billing purposes. ?[employer] offers a sliding scale Patient Assistance Program for patients that do not have  ?insurance, or whose insurance does not completely cover the cost of the ZIO monitor.  ?You must apply for the Patient Assistance Program to qualify for this discounted rate.  ?  To apply, please call [employer] at [phone], select option 4, select option 2, ask to apply for  ?Patient Assistance Program. Theodore Demark will ask your household income, and how many people  ?are in your household. They will quote your out-of-pocket cost based on that information.  ?[employer] will also be able to set up a 66-month, interest-free payment plan if needed. ? ?Applying the monitor ?  ?Shave  hair from upper left chest.  ?Hold abrader disc by orange tab. Rub abrader in 40 strokes over the upper left chest as  ?indicated in your monitor instructions.  ?Clean area with 4 enclosed alcohol pads. Let dry.  ?Apply patch as indicated in monitor instructions. Patch will be placed under collarbone on left  ?side of chest with arrow pointing upward.  ?Rub patch adhesive wings for 2 minutes. Remove white label marked "1". Remove the white  ?label marked "2". Rub patch adhesive wings for 2 additional minutes.  ?While looking in a mirror, press and release button in center of patch. A small green light will  ?flash 3-4 times. This will be your only indicator that the monitor has been turned on.  ?Do not shower for the first 24 hours. You may shower after the first 24 hours.  ?Press the button if you feel a symptom. You will hear a small click. Record Date, Time and  ?Symptom in the Patient Logbook.  ?When you are ready to remove the patch, follow instructions on the last 2 pages of Patient  ?Logbook. Stick patch monitor onto the last page of Patient Logbook.  ?Place Patient Logbook in the blue and white box. Use locking tab on box and tape box closed  ?securely. The blue and white box has prepaid postage on it. Please place it in the mailbox as  ?soon as possible. Your physician should have your test results approximately 7 days after the  ?monitor has been mailed back to [facility].  ?Call [facility] at [phone] if you have questions regarding  ?your ZIO XT patch monitor. Call them immediately if you see an orange light blinking on your  ?monitor.  ?If your monitor falls off in less than 4 days, contact our Monitor department at [phone].  ?If your monitor becomes loose or falls off after 4 days call [employer] at [phone] for  ?suggestions on securing your monitor ? ?  ? ?Important Information About Sugar ? ? ? ? ?  ?

## 2021-06-10 NOTE — Progress Notes (Signed)
?Cardiology Office Note:   ? ?Date:  06/10/2021  ? ?ID:  Natasha MohrJeanne R Kowalski, DOB 1970/12/31, MRN [id number] ? ?PCP:  Mickie KayJones, Caroline C, PA ?  ?[facility] Providers ?Cardiologist:  Kristeen MissPhilip Lyvia Mondesir, MD { ? ? ?Referring MD: Tilden Fossaees, Elizabeth, MD  ? ?Chief Complaint  ?Patient presents with  ? Palpitations  ? ? ?History of Present Illness:  ?06/10/21  ? ?Natasha Watts is a 51 y.o. female with a hx of palpitations ?Was seen in the ER on Sunday am. ?Dx with PVCs ?Started 6 days ago  ?Associated with lightheadedness , ?Some chest tightness.  ? ?No CP or dyspnea when she does exercise  ? ? ?Exercises occasionally  ?Babysits her grand kids ? 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  ? ?Recently started taking GABA release for sleep ?Stopped taking it 3-4 days .  ? ?Started having hot flashes for 5 - 6 months  ? ? ?Past Medical History:  ?Diagnosis Date  ? Chicken pox as a child  ? Fatigue 09/23/2011  ? Insomnia   ? Myalgia 11/03/2011  ? Nausea alone 06/30/2012  ? Overweight(278.02)   ? Perimenopause 06/30/2012  ? Preventative health care 09/23/2011  ? Vitamin D deficiency 09/23/2011  ? ? ?Past Surgical History:  ?Procedure Laterality Date  ? BREAST SURGERY  2004  ? breast lift  ? tummy tuck  2004  ? ? ?Current Medications: ?Current Meds  ?Medication Sig  ? testosterone cypionate (DEPOTESTOSTERONE CYPIONATE) 200 MG/ML injection 0.5 Milliliter(s) IM Once a Month  ?  ? ?Allergies:   Hydrocodone-acetaminophen and Ibuprofen  ? ?Social History  ? ?Socioeconomic History  ? Marital status: Married  ?  Spouse name: Not on file  ? Number of children: Not on file  ? Years of education: Not on file  ? Highest education level: Not on file  ?Occupational History  ? Not on file  ?Tobacco Use  ? Smoking status: Former  ?  Packs/day: 1.00  ?  Years: 6.00  ?  Pack years: 6.00  ?  Types: Cigarettes  ?  Quit date: 02/15/1990  ?  Years since quitting: 31.3  ? Smokeless tobacco: Never  ?Substance and Sexual Activity  ? Alcohol use: No  ? Drug use: No  ? Sexual  activity: Yes  ?Other Topics Concern  ? Not on file  ?Social History Narrative  ? Not on file  ? ?Social Determinants of Health  ? ?Financial Resource Strain: Not on file  ?Food Insecurity: Not on file  ?Transportation Needs: Not on file  ?Physical Activity: Not on file  ?Stress: Not on file  ?Social Connections: Not on file  ?  ? ?Family History: ?The patient's family history includes Asthma in her daughter; Cancer in her maternal grandfather and maternal grandmother. ? ?ROS:   ?Please see the history of present illness.    ? All other systems reviewed and are negative. ? ?EKGs/Labs/Other Studies Reviewed:   ? ?The following studies were reviewed today: ? ? ?EKG: April 23, 023 ?NSR, no ST or T wave changes ? ?Recent Labs: ?06/07/2021: BUN 14; Creatinine, Ser 0.83; Hemoglobin 12.4; Magnesium 2.2; Platelets 240; Potassium 3.8; Sodium 140  ?Recent Lipid Panel ?   ?Component Value Date/Time  ? CHOL 194 06/26/2012 0922  ? TRIG 58 06/26/2012 0922  ? HDL 47 06/26/2012 0922  ? CHOLHDL 4.1 06/26/2012 0922  ? VLDL 12 06/26/2012 0922  ? LDLCALC 135 (H) 06/26/2012 40980922  ? LDLDIRECT 154.7 09/23/2011 1042  ? ? ? ?Risk Assessment/Calculations:   ?  ? ?    ? ?  Physical Exam:   ? ?VS:  BP 102/60 (BP Location: Right Arm, Patient Position: Sitting, Cuff Size: Normal)   Pulse 69   Ht 5\' 2"  (1.575 m)   Wt 145 lb 9.6 oz (66 kg)   LMP 06/06/2021   SpO2 98%   BMI 26.63 kg/m?    ? ?Wt Readings from Last 3 Encounters:  ?06/10/21 145 lb 9.6 oz (66 kg)  ?06/07/21 145 lb (65.8 kg)  ?09/03/20 134 lb (60.8 kg)  ?  ? ?GEN:  Well nourished, well developed in no acute distress ?HEENT: Normal ?NECK: No JVD; No carotid bruits ?LYMPHATICS: No lymphadenopathy ?CARDIAC: RRR, no murmurs, rubs, gallops ?RESPIRATORY:  Clear to auscultation without rales, wheezing or rhonchi  ?ABDOMEN: Soft, non-tender, non-distended ?MUSCULOSKELETAL:  No edema; No deformity  ?SKIN: Warm and dry ?NEUROLOGIC:  Alert and oriented x 3 ?PSYCHIATRIC:  Normal affect   ? ?ASSESSMENT:   ? ?1. Palpitations   ?2. Hyperlipidemia, unspecified hyperlipidemia type   ? ?PLAN:   ? ?In order of problems listed above: ? ?Palpitations.    Renee presents for further evaluation of palpitations / PVCs  ?Will check TSH,  ?Place a 14 day event monitor ?We discussed the typical causes of PVCs ?Has been having menapause symptoms for 5-6 months - this could be hormonal . ?Also was on a suppliment - GABA relesae ?Stopped this medication - which may be helping with the palpitations ? ?Will see what her PVC / arrhythmia burden is  ?I'll see her back in 3 months for follow up visi t ? ? ? ?   ? ?   ? ? ?Medication Adjustments/Labs and Tests Ordered: ?Current medicines are reviewed at length with the patient today.  Concerns regarding medicines are outlined above.  ?Orders Placed This Encounter  ?Procedures  ? Lipid panel  ? TSH  ? LONG TERM MONITOR (3-14 DAYS)  ? ?No orders of the defined types were placed in this encounter. ? ? ?Patient Instructions  ?Medication Instructions:  ?Your physician recommends that you continue on your current medications as directed. Please refer to the Current Medication list given to you today. ? ?*If you need a refill on your cardiac medications before your next appointment, please call your pharmacy* ? ? ?Lab Work: ?TSH, Lipids Today ?If you have labs (blood work) drawn today and your tests are completely normal, you will receive your results only by: ?MyChart Message (if you have MyChart) OR ?A paper copy in the mail ?If you have any lab test that is abnormal or we need to change your treatment, we will call you to review the results. ? ? ?Testing/Procedures: ?14 day Zio monitor ?Your physician has recommended that you wear an event monitor. Event monitors are medical devices that record the heart?s electrical activity. Doctors most often 09/05/20 these monitors to diagnose arrhythmias. Arrhythmias are problems with the speed or rhythm of the heartbeat. The monitor is a  small, portable device. You can wear one while you do your normal daily activities. This is usually used to diagnose what is causing palpitations/syncope (passing out). ? ? ? ?Follow-Up: ?At [facility], you and your health needs are our priority.  As part of our continuing mission to provide you with exceptional heart care, we have created designated Provider Care Teams.  These Care Teams include your primary Cardiologist (physician) and Advanced Practice Providers (APPs -  Physician Assistants and Nurse Practitioners) who all work together to provide you with the care you need, when you need  it. ? ?We recommend signing up for the patient portal called "MyChart".  Sign up information is provided on this After Visit Summary.  MyChart is used to connect with patients for Virtual Visits (Telemedicine).  Patients are able to view lab/test results, encounter notes, upcoming appointments, etc.  Non-urgent messages can be sent to your provider as well.   ?To learn more about what you can do with MyChart, go to [email or web address].   ? ?Your next appointment:   ?3 month(s) ? ?The format for your next appointment:   ?In Person ? ?Provider:   ?Kristeen Miss, MD ? ?If primary card or EP is not listed click here to update    :1}  ? ? ?Other Instructions ?ZIO XT- Long Term Monitor Instructions ? ?Your physician has requested you wear a ZIO patch monitor for 14 days.  ?This is a single patch monitor. [employer] supplies one patch monitor per enrollment. Additional ?stickers are not available. Please do not apply patch if you will be having a Nuclear Stress Test,  ?Echocardiogram, Cardiac CT, MRI, or Chest Xray during the period you would be wearing the  ?monitor. The patch cannot be worn during these tests. You cannot remove and re-apply the  ?ZIO XT patch monitor.  ?Your ZIO patch monitor will be mailed 3 day USPS to your address on file. It may take 3-5 days  ?to receive your monitor after you have been enrolled.  ?Once  you have received your monitor, please review the enclosed instructions. Your monitor  ?has already been registered assigning a specific monitor serial # to you. ? ?Billing and Patient Assistance Program Information ? ?We have s

## 2021-06-10 NOTE — Progress Notes (Unsigned)
Enrolled patient for a 14 day Zio XT  monitor to be mailed to patients home  °

## 2021-06-11 ENCOUNTER — Telehealth: Payer: Self-pay

## 2021-06-11 DIAGNOSIS — E785 Hyperlipidemia, unspecified: Secondary | ICD-10-CM

## 2021-06-11 LAB — LIPID PANEL
Chol/HDL Ratio: 4.1 ratio (ref 0.0–4.4)
Cholesterol, Total: 215 mg/dL — ABNORMAL HIGH (ref 100–199)
HDL: 53 mg/dL (ref >39)
LDL Chol Calc (NIH): 150 mg/dL — ABNORMAL HIGH (ref 0–99)
Triglycerides: 68 mg/dL (ref 0–149)
VLDL Cholesterol Cal: 12 mg/dL (ref 5–40)

## 2021-06-11 LAB — TSH: TSH: 3.77 u[IU]/mL (ref 0.450–4.500)

## 2021-06-11 NOTE — Telephone Encounter (Signed)
Called and spoke directly with patient who agrees to plan for the Coronary calcium score CT and made aware of the $99 cost.  ?

## 2021-06-11 NOTE — Telephone Encounter (Signed)
-----   Message from Vesta Mixer, MD sent at 06/11/2021  1:00 PM EDT ----- ?Renee has moderate hyperlipidemia. ?I would like for her to get a coronary calcium score to assess her overall risk of developing CAD  ?

## 2021-06-15 DIAGNOSIS — E785 Hyperlipidemia, unspecified: Secondary | ICD-10-CM | POA: Diagnosis not present

## 2021-06-15 DIAGNOSIS — R002 Palpitations: Secondary | ICD-10-CM

## 2021-07-28 ENCOUNTER — Other Ambulatory Visit: Payer: Commercial Managed Care - PPO

## 2021-09-07 ENCOUNTER — Ambulatory Visit: Payer: Commercial Managed Care - PPO | Admitting: Cardiovascular Disease

## 2023-02-12 IMAGING — DX DG CHEST 1V PORT
1 series · 1 of 1 positions shown · non-contrast
Comparison: No comparison studies

CLINICAL DATA: Heart palpitations with intermittent shortness of
breath and chest tightness.

EXAM:
PORTABLE CHEST 1 VIEW

[chest ap]
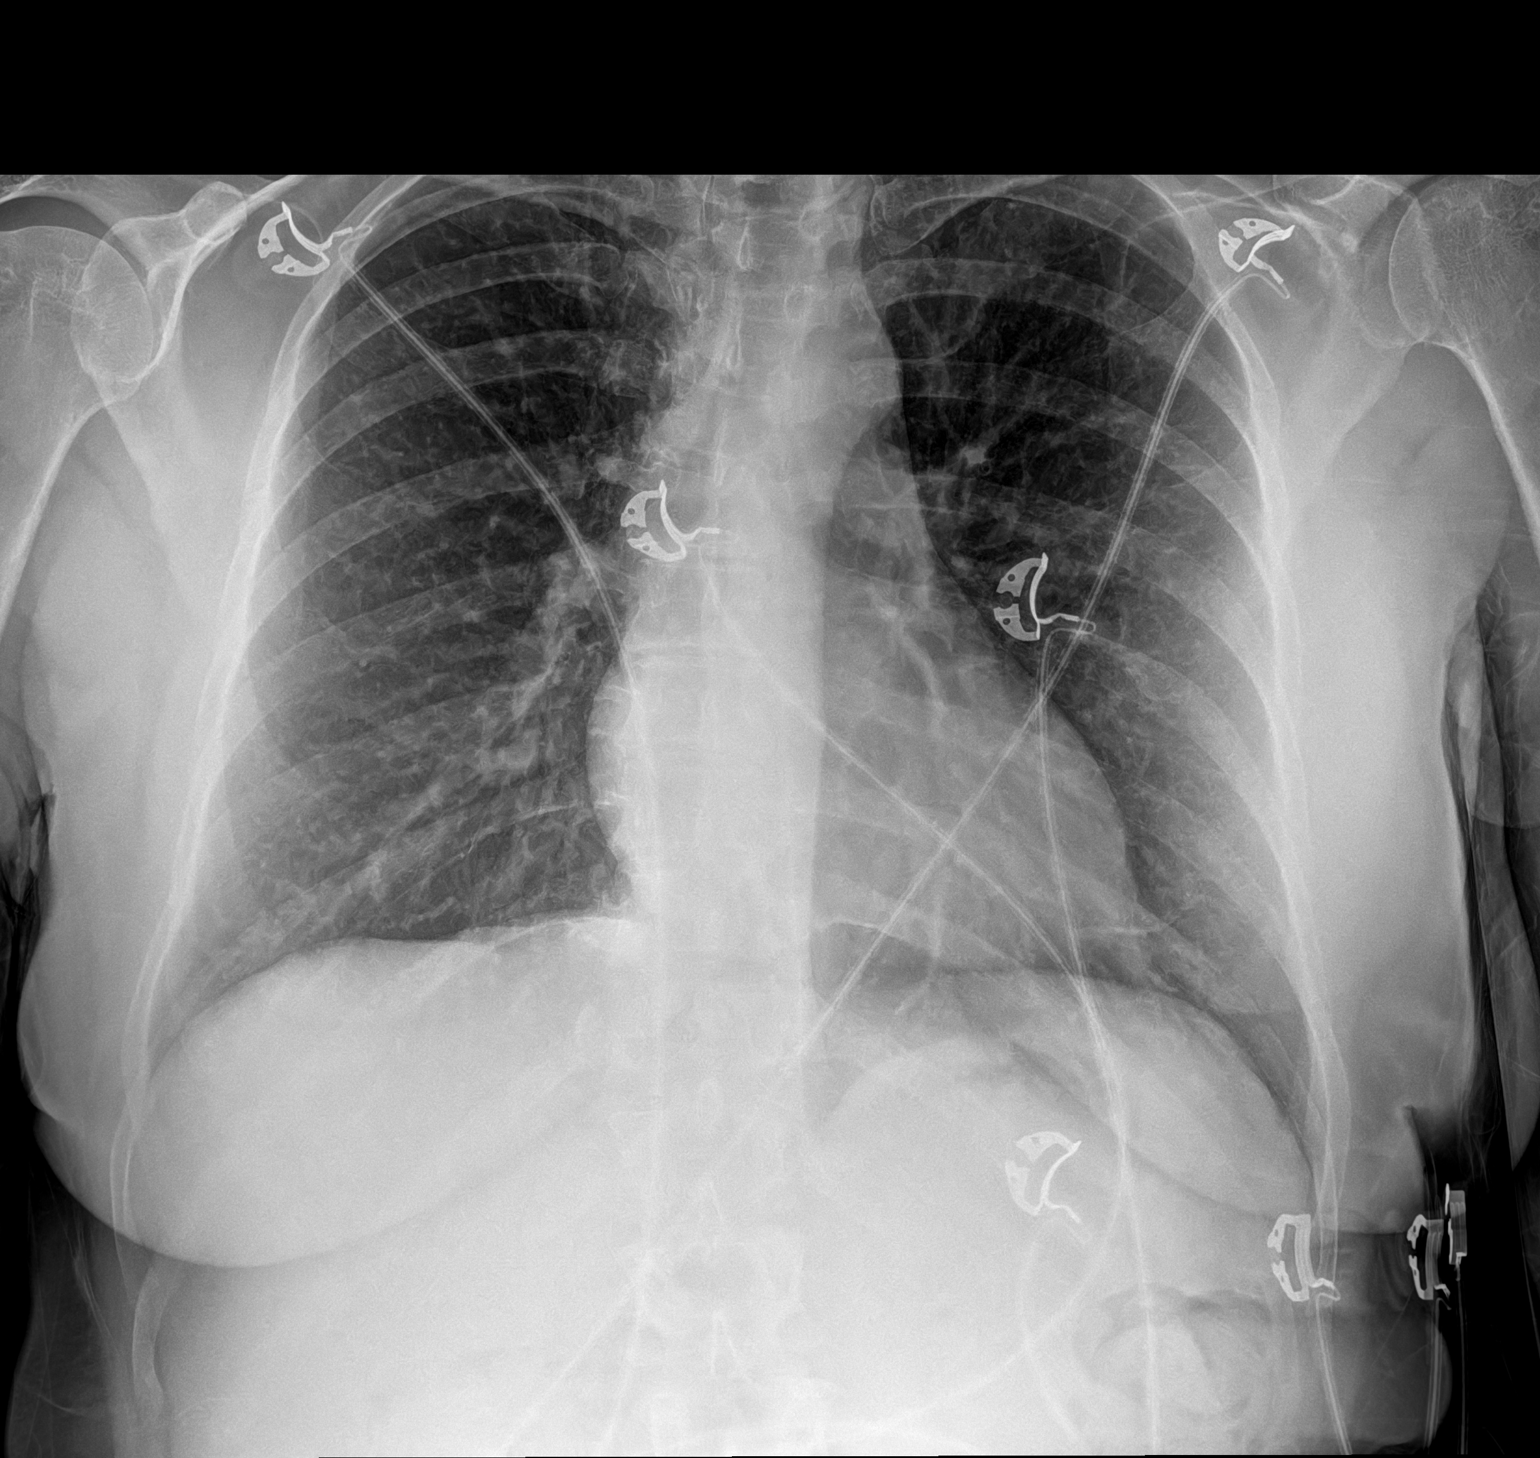

[1 of 1 positions shown; findings below may reference images not displayed]

FINDINGS: The heart size and mediastinal contours are within normal limits.
Both lungs are clear. The visualized skeletal structures are
unremarkable. Bilateral breast augmentations are faintly visible.
IMPRESSION: No active disease.

## 2023-11-08 ENCOUNTER — Other Ambulatory Visit (HOSPITAL_BASED_OUTPATIENT_CLINIC_OR_DEPARTMENT_OTHER): Payer: Self-pay

## 2023-11-09 ENCOUNTER — Other Ambulatory Visit (HOSPITAL_COMMUNITY): Payer: Self-pay

## 2023-11-09 MED ORDER — ESTRADIOL 0.05 MG/24HR TD PTTW
1.0000 | MEDICATED_PATCH | TRANSDERMAL | 1 refills | Status: DC
Start: 2023-09-22 — End: 2024-01-10
  Filled 2023-11-09 – 2023-11-14 (×2): qty 8, 28d supply, fill #0
  Filled 2023-12-09 (×2): qty 8, 28d supply, fill #1

## 2023-11-14 ENCOUNTER — Other Ambulatory Visit (HOSPITAL_COMMUNITY): Payer: Self-pay

## 2023-12-09 ENCOUNTER — Other Ambulatory Visit (HOSPITAL_COMMUNITY): Payer: Self-pay

## 2024-01-02 ENCOUNTER — Other Ambulatory Visit (HOSPITAL_COMMUNITY): Payer: Self-pay

## 2024-01-10 ENCOUNTER — Other Ambulatory Visit (HOSPITAL_COMMUNITY): Payer: Self-pay

## 2024-01-10 MED ORDER — ESTRADIOL 0.05 MG/24HR TD PTTW
1.0000 | MEDICATED_PATCH | TRANSDERMAL | 0 refills | Status: AC
  Filled 2024-01-10: qty 8, 28d supply, fill #0
  Filled 2024-02-01 – 2024-02-02 (×2): qty 8, 28d supply, fill #1

## 2024-01-11 ENCOUNTER — Other Ambulatory Visit: Payer: Self-pay

## 2024-02-01 ENCOUNTER — Other Ambulatory Visit (HOSPITAL_COMMUNITY): Payer: Self-pay

## 2024-02-01 ENCOUNTER — Other Ambulatory Visit: Payer: Self-pay

## 2024-02-02 ENCOUNTER — Other Ambulatory Visit (HOSPITAL_COMMUNITY): Payer: Self-pay

## 2024-02-02 ENCOUNTER — Other Ambulatory Visit: Payer: Self-pay

## 2024-02-06 ENCOUNTER — Other Ambulatory Visit: Payer: Self-pay
